# Patient Record
Sex: Female | Born: 1950 | Race: White | Hispanic: No | Marital: Married | Smoking: Never smoker
Health system: Southern US, Community
[De-identification: ages and names within clinical notes are randomized; demographics above are authoritative.]

## PROBLEM LIST (undated history)

## (undated) HISTORY — PX: TUBAL LIGATION: SHX77

---

## 2008-06-10 ENCOUNTER — Ambulatory Visit: Payer: Self-pay | Admitting: Gastroenterology

## 2009-10-08 ENCOUNTER — Ambulatory Visit: Payer: Self-pay | Admitting: Internal Medicine

## 2010-10-19 ENCOUNTER — Ambulatory Visit: Payer: Self-pay | Admitting: Internal Medicine

## 2010-10-21 ENCOUNTER — Ambulatory Visit: Payer: Self-pay | Admitting: Internal Medicine

## 2011-05-18 ENCOUNTER — Ambulatory Visit: Payer: Self-pay | Admitting: Internal Medicine

## 2011-10-31 ENCOUNTER — Ambulatory Visit: Payer: Self-pay | Admitting: Surgery

## 2011-11-15 ENCOUNTER — Ambulatory Visit: Payer: Self-pay | Admitting: Surgery

## 2011-12-29 HISTORY — PX: BREAST BIOPSY: SHX20

## 2012-02-07 ENCOUNTER — Ambulatory Visit: Payer: Self-pay | Admitting: Anesthesiology

## 2012-02-07 DIAGNOSIS — I10 Essential (primary) hypertension: Secondary | ICD-10-CM

## 2012-03-06 ENCOUNTER — Ambulatory Visit: Payer: Self-pay | Admitting: Surgery

## 2012-03-07 LAB — PATHOLOGY REPORT

## 2013-01-24 ENCOUNTER — Ambulatory Visit: Payer: Self-pay | Admitting: Physician Assistant

## 2014-03-14 ENCOUNTER — Ambulatory Visit: Payer: Self-pay | Admitting: Physician Assistant

## 2014-09-16 NOTE — Op Note (Signed)
PATIENT NAME:  Amy BostonBURNETTE, [name] MR#:  [id number] DATE OF BIRTH:  [birth year] ____________________________ J. Renda RollsWilton Dalinda Heidt, MD jws:slb D: 03/06/2012 12:27:16 ET T: 03/06/2012 12:42:23 ET JOB#: [id number]  cc: Adella HareJ. Wilton [name], MD, <Dictator> Adella HareWILTON J Johnmark Geiger MD ELECTRONICALLY SIGNED 03/08/2012 12:38

## 2014-09-16 NOTE — Op Note (Signed)
PATIENT NAME:  Amy BostonBURNETTE, [name] MR#:  [id number] DATE OF BIRTH:  Apr 01, 1951  DATE OF PROCEDURE:  03/06/2012  PREOPERATIVE DIAGNOSIS: Left breast mass.   POSTOPERATIVE DIAGNOSIS: Left breast mass.   PROCEDURE: Excision of left breast mass.   SURGEON: Adella HareJ. Wilton Smith, MD   INDICATIONS: This 64 year old female has history of left breast pain and had mammogram and ultrasound. The ultrasound demonstrated a small mass at the 6 o'clock position in the retroareolar portion of the left breast. She had ultrasound-guided core biopsy which demonstrated sclerosis and hyperplasia and focal area of florid ductal hyperplasia and subsequently excisional biopsy was recommended for further evaluation.   DESCRIPTION OF PROCEDURE: The patient did have preoperative ultrasound-guided insertion of Kopans wire. I reviewed the ultrasound images which demonstrate the location of the small nodule in the retroareolar portion at the 6 o'clock position in the left breast. You could see the location of the hook which had passed just beyond the mass extending from left to right.   The patient was placed on the operating table in the supine position under general anesthesia. The dressing was removed from the left breast exposing the Kopans wire which entered the breast laterally and could palpate and demonstrate the location of the wire at the 6 o'clock position. The wire was cut 3 cm from the skin. The breast was prepared with ChloraPrep and draped in a sterile manner. A curvilinear incision was made directly overlying the wire. The incision was approximately 2 cm in length and removed a narrow ellipse of skin directly overlying the wire. I dissected down around the wire and removed a portion of tissue which was approximately 1.5 x 1 x 1 cm in dimension. There was some firmness within the tissue and was submitted for routine pathology. The wound was inspected. Several small bleeding points were electrocauterized. Hemostasis was  subsequently intact. Tissues were infiltrated with 0.5% Sensorcaine with epinephrine. The wound was closed with a running 5-0 Monocryl subcuticular suture and Dermabond. There was some scant bleeding from the entrance point of the Kopans wire which was dressed with a Band-Aid.  The patient tolerated the procedure satisfactorily and was then prepared for transfer to the recovery room.   ____________________________ Shela CommonsJ. Renda RollsWilton Smith, MD jws:drc D: 03/06/2012 12:31:50 ET T: 03/06/2012 12:53:02 ET JOB#: [id number]  cc: Adella HareJ. Wilton Smith, MD, <Dictator> Adella HareWILTON J SMITH MD ELECTRONICALLY SIGNED 03/08/2012 12:38

## 2015-08-06 ENCOUNTER — Other Ambulatory Visit: Payer: Self-pay | Admitting: Physician Assistant

## 2015-08-06 DIAGNOSIS — Z1231 Encounter for screening mammogram for malignant neoplasm of breast: Secondary | ICD-10-CM

## 2015-08-18 ENCOUNTER — Ambulatory Visit: Payer: Self-pay

## 2015-08-24 ENCOUNTER — Ambulatory Visit
Admission: RE | Admit: 2015-08-24 | Discharge: 2015-08-24 | Disposition: A | Discharge status: Home / Self Care | Payer: BLUE CROSS/BLUE SHIELD | Source: Ambulatory Visit | Attending: Physician Assistant | Admitting: Physician Assistant

## 2015-08-24 DIAGNOSIS — Z1231 Encounter for screening mammogram for malignant neoplasm of breast: Secondary | ICD-10-CM | POA: Diagnosis present

## 2015-12-25 ENCOUNTER — Other Ambulatory Visit: Payer: Self-pay | Admitting: Sports Medicine

## 2015-12-25 ENCOUNTER — Ambulatory Visit
Admission: RE | Admit: 2015-12-25 | Discharge: 2015-12-25 | Disposition: A | Discharge status: Home / Self Care | Payer: BLUE CROSS/BLUE SHIELD | Source: Ambulatory Visit | Attending: Sports Medicine | Admitting: Sports Medicine

## 2015-12-25 DIAGNOSIS — Z01818 Encounter for other preprocedural examination: Secondary | ICD-10-CM | POA: Insufficient documentation

## 2015-12-25 DIAGNOSIS — M795 Residual foreign body in soft tissue: Secondary | ICD-10-CM

## 2016-04-14 ENCOUNTER — Ambulatory Visit (INDEPENDENT_AMBULATORY_CARE_PROVIDER_SITE_OTHER): Payer: BLUE CROSS/BLUE SHIELD | Admitting: Vascular Surgery

## 2016-04-14 ENCOUNTER — Encounter (INDEPENDENT_AMBULATORY_CARE_PROVIDER_SITE_OTHER): Payer: Self-pay | Admitting: Vascular Surgery

## 2016-04-14 DIAGNOSIS — M79605 Pain in left leg: Secondary | ICD-10-CM

## 2016-04-14 DIAGNOSIS — M79609 Pain in unspecified limb: Secondary | ICD-10-CM | POA: Insufficient documentation

## 2016-04-14 DIAGNOSIS — M7989 Other specified soft tissue disorders: Secondary | ICD-10-CM | POA: Diagnosis not present

## 2016-04-14 DIAGNOSIS — I8311 Varicose veins of right lower extremity with inflammation: Secondary | ICD-10-CM

## 2016-04-14 DIAGNOSIS — M79604 Pain in right leg: Secondary | ICD-10-CM

## 2016-04-14 DIAGNOSIS — I8312 Varicose veins of left lower extremity with inflammation: Secondary | ICD-10-CM | POA: Diagnosis not present

## 2016-04-14 DIAGNOSIS — I872 Venous insufficiency (chronic) (peripheral): Secondary | ICD-10-CM | POA: Insufficient documentation

## 2016-04-14 NOTE — Progress Notes (Signed)
MRN : [id number]  Amy Farley is a 65 y.o. (07-05-1950) female who presents with chief complaint of  Chief Complaint  Patient presents with  . New Patient (Initial Visit)  .  History of Present Illness:   The patient is seen for evaluation of symptomatic varicose veins. The patient relates burning and stinging which worsened steadily throughout the course of the day, particularly with standing. The patient also notes an aching and throbbing pain over the varicosities, particularly with prolonged dependent positions. The symptoms are significantly improved with elevation.  The patient also notes that during hot weather the symptoms are greatly intensified. The patient states the pain from the varicose veins interferes with work, daily exercise, shopping and household maintenance. At this point, the symptoms are persistent and severe enough that they're having a negative impact on lifestyle and are interfering with daily activities.  There is no history of DVT, PE or superficial thrombophlebitis. There is no history of ulceration or hemorrhage. The patient denies a significant family history of varicose veins.  The patient has not worn graduated compression in the past. At the present time the patient has not been using over-the-counter analgesics. There is no history of prior surgical intervention or sclerotherapy.  Current Meds  Medication Sig  . cyanocobalamin 1000 MCG tablet Take 1,000 mcg by mouth daily.  . Multiple Vitamin (MULTIVITAMIN) capsule Take 1 capsule by mouth daily.    History reviewed. No pertinent past medical history.  Past Surgical History:  Procedure Laterality Date  . BREAST BIOPSY Left 12/2011   [place] core neg  . TUBAL LIGATION      Social History Social History  Substance Use Topics  . Smoking status: Never Smoker  . Smokeless tobacco: Never Used  . Alcohol use Yes     Comment: occasionally    Family History Family History  Problem Relation Age of  Onset  . Breast cancer Sister 66  . Breast cancer Maternal Grandmother   . Congestive Heart Failure Mother   . Hyperlipidemia Mother   . Hypertension Mother   . Stroke Mother   . Diabetes Father   . Hyperlipidemia Father   . Hypertension Father   No family history of bleeding/clotting disorders, porphyria or autoimmune disease   Allergies  Allergen Reactions  . Sulfa Antibiotics     Other reaction(s): Unknown  . Augmentin [Amoxicillin-Pot Clavulanate] Hives     REVIEW OF SYSTEMS (Negative unless checked)  Constitutional: [] Weight loss  [] Fever  [] Chills Cardiac: [] Chest pain   [] Chest pressure   [] Palpitations   [] Shortness of breath when laying flat   [] Shortness of breath with exertion. Vascular:  [] Pain in legs with walking   [] Pain in legs at rest  [] History of DVT   [] Phlebitis   [] Swelling in legs   [] Varicose veins   [] Non-healing ulcers Pulmonary:   [] Uses home oxygen   [] Productive cough   [] Hemoptysis   [] Wheeze  [] COPD   [] Asthma Neurologic:  [] Dizziness   [] Seizures   [] History of stroke   [] History of TIA  [] Aphasia   [] Vissual changes   [] Weakness or numbness in arm   [] Weakness or numbness in leg Musculoskeletal:   [] Joint swelling   [] Joint pain   [] Low back pain Hematologic:  [] Easy bruising  [] Easy bleeding   [] Hypercoagulable state   [] Anemic Gastrointestinal:  [] Diarrhea   [] Vomiting  [] Gastroesophageal reflux/heartburn   [] Difficulty swallowing. Genitourinary:  [] Chronic kidney disease   [] Difficult urination  [] Frequent urination   [] Blood  in urine Skin:  [] Rashes   [] Ulcers  Psychological:  [] History of anxiety   []  History of major depression.  Physical Examination  Vitals:   04/14/16 1350  BP: 135/77  Pulse: 77  Resp: 16  Weight: 153 lb (69.4 kg)  Height: 5\' 1"  (1.549 m)   Body mass index is 28.91 kg/m. Gen: WD/WN, NAD Head: [place]/AT, No temporalis wasting.  Ear/Nose/Throat: Hearing grossly intact, nares w/o erythema or drainage, poor  dentition Eyes: PER, EOMI, sclera nonicteric.  Neck: Supple, no masses.  No bruit or JVD.  Pulmonary:  Good air movement, clear to auscultation bilaterally, no use of accessory muscles.  Cardiac: RRR, normal S1, S2, no Murmurs. Vascular: varicosities >6 mm bilaterally mild stasis changes 2+ edema Vessel Right Left  Radial Palpable Palpable  Ulnar Palpable Palpable  Brachial Palpable Palpable  Carotid Palpable Palpable  Femoral Palpable Palpable  Popliteal Palpable Palpable  PT Palpable Palpable  DP Palpable Palpable   Gastrointestinal: soft, non-distended. No guarding/no peritoneal signs.  Musculoskeletal: M/S 5/5 throughout.  No deformity or atrophy.  Neurologic: CN 2-12 intact. Pain and light touch intact in extremities.  Symmetrical.  Speech is fluent. Motor exam as listed above. Psychiatric: Judgment intact, Mood & affect appropriate for pt's clinical situation. Dermatologic: No rashes or ulcers noted.  No changes consistent with cellulitis. Lymph : No Cervical lymphadenopathy, no lichenification or skin changes of chronic lymphedema.  CBC No results found for: WBC, HGB, HCT, MCV, PLT  BMET No results found for: NA, K, CL, CO2, GLUCOSE, BUN, CREATININE, CALCIUM, GFRNONAA, GFRAA CrCl cannot be calculated (No order found.).  COAG No results found for: INR, PROTIME  Radiology No results found.  Assessment/Plan 1. Varicose veins of both lower extremities with inflammation Recommend:  The patient has large symptomatic varicose veins that are painful and associated with swelling.  I have had a long discussion with the patient regarding  varicose veins and why they cause symptoms.  Patient will begin wearing graduated compression stockings class 1 on a daily basis, beginning first thing in the morning and removing them in the evening. The patient is instructed specifically not to sleep in the stockings.    The patient  will also begin using over-the-counter analgesics such as  Motrin 600 mg po TID to help control the symptoms.    In addition, behavioral modification including elevation during the day will be initiated.    Pending the results of these changes the  patient will be reevaluated in three months.   An  ultrasound of the venous system will be obtained.   Further plans will be based on the ultrasound results and whether conservative therapies are successful at eliminating the pain and swelling.  2. Venous (peripheral) insufficiency Recommend:  The patient has large symptomatic varicose veins that are painful and associated with swelling.  I have had a long discussion with the patient regarding  varicose veins and why they cause symptoms.  Patient will begin wearing graduated compression stockings class 1 on a daily basis, beginning first thing in the morning and removing them in the evening. The patient is instructed specifically not to sleep in the stockings.    The patient  will also begin using over-the-counter analgesics such as Motrin 600 mg po TID to help control the symptoms.    In addition, behavioral modification including elevation during the day will be initiated.    Pending the results of these changes the  patient will be reevaluated in three months.  An  ultrasound of the venous system will be obtained.   Further plans will be based on the ultrasound results and whether conservative therapies are successful at eliminating the pain and swelling.  3. Pain in both lower extremities See #1  4. Leg swelling See #1    Levora DredgeGregory Clive Parcel, MD  04/14/2016 5:23 PM

## 2016-06-02 ENCOUNTER — Other Ambulatory Visit (INDEPENDENT_AMBULATORY_CARE_PROVIDER_SITE_OTHER): Payer: Self-pay | Admitting: Vascular Surgery

## 2016-06-02 ENCOUNTER — Encounter (INDEPENDENT_AMBULATORY_CARE_PROVIDER_SITE_OTHER): Payer: Self-pay | Admitting: Vascular Surgery

## 2016-06-02 ENCOUNTER — Ambulatory Visit (INDEPENDENT_AMBULATORY_CARE_PROVIDER_SITE_OTHER): Payer: BLUE CROSS/BLUE SHIELD | Admitting: Vascular Surgery

## 2016-06-02 ENCOUNTER — Ambulatory Visit (INDEPENDENT_AMBULATORY_CARE_PROVIDER_SITE_OTHER): Payer: BLUE CROSS/BLUE SHIELD

## 2016-06-02 VITALS — BP 141/74 | HR 70 | Resp 17 | Wt 152.4 lb

## 2016-06-02 DIAGNOSIS — M79605 Pain in left leg: Secondary | ICD-10-CM

## 2016-06-02 DIAGNOSIS — M7989 Other specified soft tissue disorders: Secondary | ICD-10-CM

## 2016-06-02 DIAGNOSIS — I8312 Varicose veins of left lower extremity with inflammation: Secondary | ICD-10-CM

## 2016-06-02 DIAGNOSIS — M79604 Pain in right leg: Secondary | ICD-10-CM | POA: Diagnosis not present

## 2016-06-02 DIAGNOSIS — I872 Venous insufficiency (chronic) (peripheral): Secondary | ICD-10-CM

## 2016-06-02 DIAGNOSIS — I8311 Varicose veins of right lower extremity with inflammation: Secondary | ICD-10-CM | POA: Diagnosis not present

## 2016-06-02 DIAGNOSIS — I8390 Asymptomatic varicose veins of unspecified lower extremity: Secondary | ICD-10-CM

## 2016-06-02 NOTE — Progress Notes (Signed)
MRN : [id number]  Amy Farley is a 66 y.o. (04/05/1951) female who presents with chief complaint of  Chief Complaint  Patient presents with  . Follow-up  .  History of Present Illness: The patient returns for followup after the initial visit. The patient continues to have pain in the lower extremities with dependency. The pain is lessened with elevation. Graduated compression stockings, Class I (20-30 mmHg), have been worn but the stockings do not eliminate the leg pain. Over-the-counter analgesics do not improve the symptoms. The degree of discomfort continues to interfere with daily activities. The patient notes the pain in the legs is causing problems with daily exercise, at the workplace and even with household activities and maintenance such as standing in the kitchen preparing meals and doing dishes.   Venous ultrasound shows normal deep venous system, no evidence of acute or chronic DVT.  Superficial reflux is not present.  Current Meds  Medication Sig  . cyanocobalamin 1000 MCG tablet Take 1,000 mcg by mouth daily.  . Multiple Vitamin (MULTIVITAMIN) capsule Take 1 capsule by mouth daily.    No past medical history on file.  Past Surgical History:  Procedure Laterality Date  . BREAST BIOPSY Left 12/2011   [place] core neg  . TUBAL LIGATION      Social History Social History  Substance Use Topics  . Smoking status: Never Smoker  . Smokeless tobacco: Never Used  . Alcohol use Yes     Comment: occasionally    Family History Family History  Problem Relation Age of Onset  . Breast cancer Sister 39  . Breast cancer Maternal Grandmother   . Congestive Heart Failure Mother   . Hyperlipidemia Mother   . Hypertension Mother   . Stroke Mother   . Diabetes Father   . Hyperlipidemia Father   . Hypertension Father   No family history of bleeding/clotting disorders, porphyria or autoimmune disease   Allergies  Allergen Reactions  . Sulfa Antibiotics     Other  reaction(s): Unknown  . Augmentin [Amoxicillin-Pot Clavulanate] Hives     REVIEW OF SYSTEMS (Negative unless checked)  Constitutional: [] Weight loss  [] Fever  [] Chills Cardiac: [] Chest pain   [] Chest pressure   [] Palpitations   [] Shortness of breath when laying flat   [] Shortness of breath with exertion. Vascular:  [] Pain in legs with walking   [x] Pain in legs with standing  [] History of DVT   [] Phlebitis   [x] Swelling in legs   [] Varicose veins   [] Non-healing ulcers Pulmonary:   [] Uses home oxygen   [] Productive cough   [] Hemoptysis   [] Wheeze  [] COPD   [] Asthma Neurologic:  [] Dizziness   [] Seizures   [] History of stroke   [] History of TIA  [] Aphasia   [] Vissual changes   [] Weakness or numbness in arm   [] Weakness or numbness in leg Musculoskeletal:   [] Joint swelling   [] Joint pain   [] Low back pain Hematologic:  [] Easy bruising  [] Easy bleeding   [] Hypercoagulable state   [] Anemic Gastrointestinal:  [] Diarrhea   [] Vomiting  [] Gastroesophageal reflux/heartburn   [] Difficulty swallowing. Genitourinary:  [] Chronic kidney disease   [] Difficult urination  [] Frequent urination   [] Blood in urine Skin:  [] Rashes   [] Ulcers  Psychological:  [] History of anxiety   []  History of major depression.  Physical Examination  Vitals:   06/02/16 1539  BP: (!) 141/74  Pulse: 70  Resp: 17  Weight: 152 lb 6.4 oz (69.1 kg)   Body mass index is 28.8 kg/m.  Gen: WD/WN, NAD Head: [place]/AT, No temporalis wasting.  Ear/Nose/Throat: Hearing grossly intact, nares w/o erythema or drainage, poor dentition Eyes: PER, EOMI, sclera nonicteric.  Neck: Supple, no masses.  No bruit or JVD.  Pulmonary:  Good air movement, clear to auscultation bilaterally, no use of accessory muscles.  Cardiac: RRR, normal S1, S2, no Murmurs. Vascular:  Multiple small varicosities and spider veins, 1-2+ edema bilaterally Vessel Right Left  Radial Palpable Palpable  Ulnar Palpable Palpable  Brachial Palpable Palpable  Carotid  Palpable Palpable  Femoral Palpable Palpable  Popliteal Palpable Palpable  PT Palpable Palpable  DP Palpable Palpable   Gastrointestinal: soft, non-distended. No guarding/no peritoneal signs.  Musculoskeletal: M/S 5/5 throughout.  No deformity or atrophy.  Neurologic: CN 2-12 intact. Pain and light touch intact in extremities.  Symmetrical.  Speech is fluent. Motor exam as listed above. Psychiatric: Judgment intact, Mood & affect appropriate for pt's clinical situation. Dermatologic: No rashes or ulcers noted.  No changes consistent with cellulitis. Lymph : No Cervical lymphadenopathy, no lichenification or skin changes of chronic lymphedema.  CBC No results found for: WBC, HGB, HCT, MCV, PLT  BMET No results found for: NA, K, CL, CO2, GLUCOSE, BUN, CREATININE, CALCIUM, GFRNONAA, GFRAA CrCl cannot be calculated (No order found.).  COAG No results found for: INR, PROTIME  Radiology No results found.   Assessment/Plan 1. Varicose veins of both lower extremities with inflammation Recommend:  The patient has had successful ablation of the previously incompetent saphenous venous system but still has persistent symptoms of pain and swelling that are having a negative impact on daily life and daily activities.  Patient should undergo injection sclerotherapy to treat the residual varicosities.  The risks, benefits and alternative therapies were reviewed in detail with the patient.  All questions were answered.  The patient agrees to proceed with sclerotherapy at their convenience.  The patient will continue wearing the graduated compression stockings and using the over-the-counter pain medications to treat her symptoms.   - Sclerotherapy; Future  2. Venous (peripheral) insufficiency No surgery or intervention at this point in time.    I have had a long discussion with the patient regarding venous insufficiency and why it  causes symptoms. I have discussed with the patient the  chronic skin changes that accompany venous insufficiency and the long term sequela such as infection and ulceration.  Patient will begin wearing graduated compression stockings class 1 (20-30 mmHg) or compression wraps on a daily basis a prescription was given. The patient will put the stockings on first thing in the morning and removing them in the evening. The patient is instructed specifically not to sleep in the stockings.    In addition, behavioral modification including several periods of elevation of the lower extremities during the day will be continued. I have demonstrated that proper elevation is a position with the ankles at heart level.  The patient is instructed to begin routine exercise, especially walking on a daily basis  3. Pain in both lower extremities See Plan #1  4. Leg swelling See Plan #2    Levora DredgeGregory Lorely Bubb, MD  06/02/2016 5:21 PM

## 2016-06-27 ENCOUNTER — Ambulatory Visit (INDEPENDENT_AMBULATORY_CARE_PROVIDER_SITE_OTHER): Payer: BLUE CROSS/BLUE SHIELD | Admitting: Vascular Surgery

## 2016-07-11 ENCOUNTER — Other Ambulatory Visit: Payer: Self-pay | Admitting: Physician Assistant

## 2016-07-11 DIAGNOSIS — Z1231 Encounter for screening mammogram for malignant neoplasm of breast: Secondary | ICD-10-CM

## 2016-08-09 ENCOUNTER — Ambulatory Visit
Admission: RE | Admit: 2016-08-09 | Discharge: 2016-08-09 | Disposition: A | Discharge status: Home / Self Care | Payer: BLUE CROSS/BLUE SHIELD | Source: Ambulatory Visit | Attending: Physician Assistant | Admitting: Physician Assistant

## 2016-08-09 DIAGNOSIS — Z1231 Encounter for screening mammogram for malignant neoplasm of breast: Secondary | ICD-10-CM | POA: Insufficient documentation

## 2017-07-31 ENCOUNTER — Other Ambulatory Visit: Payer: Self-pay | Admitting: Physician Assistant

## 2017-07-31 DIAGNOSIS — Z1231 Encounter for screening mammogram for malignant neoplasm of breast: Secondary | ICD-10-CM

## 2017-08-10 ENCOUNTER — Ambulatory Visit
Admission: RE | Admit: 2017-08-10 | Discharge: 2017-08-10 | Disposition: A | Discharge status: Home / Self Care | Payer: Self-pay | Source: Ambulatory Visit | Attending: Physician Assistant | Admitting: Physician Assistant

## 2017-08-10 DIAGNOSIS — Z1231 Encounter for screening mammogram for malignant neoplasm of breast: Secondary | ICD-10-CM | POA: Insufficient documentation

## 2018-02-16 ENCOUNTER — Other Ambulatory Visit: Payer: Self-pay | Admitting: Physician Assistant

## 2018-02-16 DIAGNOSIS — R1032 Left lower quadrant pain: Secondary | ICD-10-CM

## 2018-02-16 DIAGNOSIS — R1013 Epigastric pain: Secondary | ICD-10-CM

## 2018-02-23 ENCOUNTER — Other Ambulatory Visit: Payer: Self-pay

## 2018-03-06 ENCOUNTER — Ambulatory Visit
Admission: RE | Admit: 2018-03-06 | Discharge: 2018-03-06 | Disposition: A | Discharge status: Home / Self Care | Payer: BLUE CROSS/BLUE SHIELD | Source: Ambulatory Visit | Attending: Physician Assistant | Admitting: Physician Assistant

## 2018-03-06 DIAGNOSIS — R1013 Epigastric pain: Secondary | ICD-10-CM

## 2018-03-06 DIAGNOSIS — R1032 Left lower quadrant pain: Secondary | ICD-10-CM

## 2018-08-07 ENCOUNTER — Other Ambulatory Visit: Payer: Self-pay | Admitting: Physician Assistant

## 2018-08-07 DIAGNOSIS — Z1231 Encounter for screening mammogram for malignant neoplasm of breast: Secondary | ICD-10-CM

## 2018-08-14 ENCOUNTER — Other Ambulatory Visit: Payer: Self-pay | Admitting: Physician Assistant

## 2018-08-14 DIAGNOSIS — N644 Mastodynia: Secondary | ICD-10-CM

## 2018-08-28 ENCOUNTER — Ambulatory Visit
Admission: RE | Admit: 2018-08-28 | Discharge: 2018-08-28 | Disposition: A | Discharge status: Home / Self Care | Payer: Medicare Other | Source: Ambulatory Visit | Attending: Physician Assistant | Admitting: Physician Assistant

## 2018-08-28 ENCOUNTER — Other Ambulatory Visit: Payer: Self-pay | Admitting: Physician Assistant

## 2018-08-28 ENCOUNTER — Other Ambulatory Visit: Payer: Self-pay

## 2018-08-28 DIAGNOSIS — N644 Mastodynia: Secondary | ICD-10-CM

## 2018-08-28 DIAGNOSIS — N6320 Unspecified lump in the left breast, unspecified quadrant: Secondary | ICD-10-CM

## 2019-08-02 IMAGING — MG DIGITAL DIAGNOSTIC BILATERAL MAMMOGRAM WITH TOMO AND CAD
8 series · 8 of 24 positions shown · non-contrast
Comparison: Previous exam(s).

CLINICAL DATA: 67-year-old female presenting for evaluation of left
breast pain, which begins around the nipple and radiates to the
superior upper-outer left breast.

EXAM:
DIGITAL DIAGNOSTIC BILATERAL MAMMOGRAM WITH CAD AND TOMO
LEFT BREAST ULTRASOUND

[L MLO synth-2D]
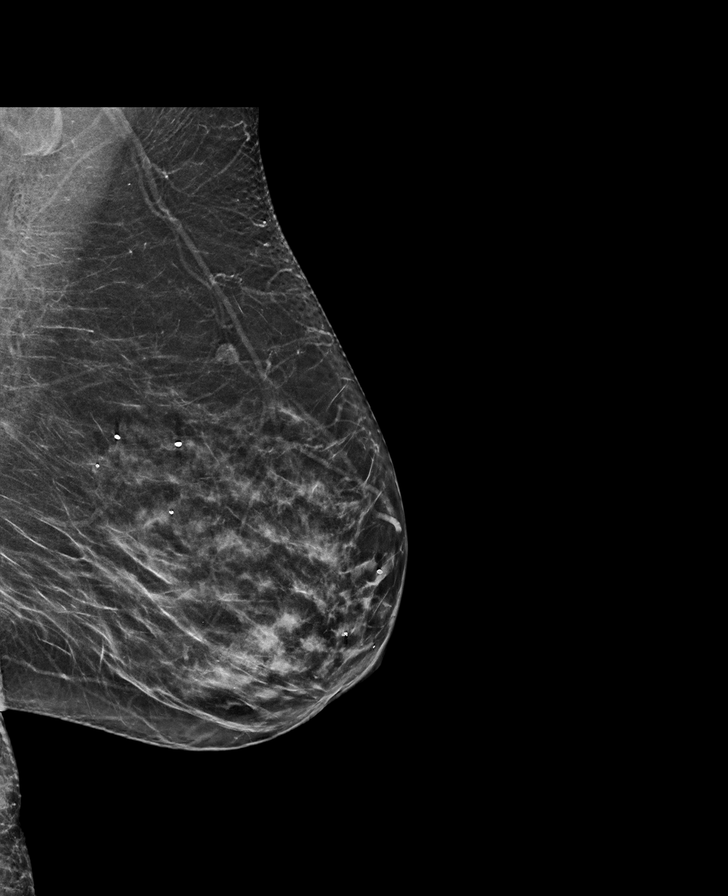

[R CC synth-2D]
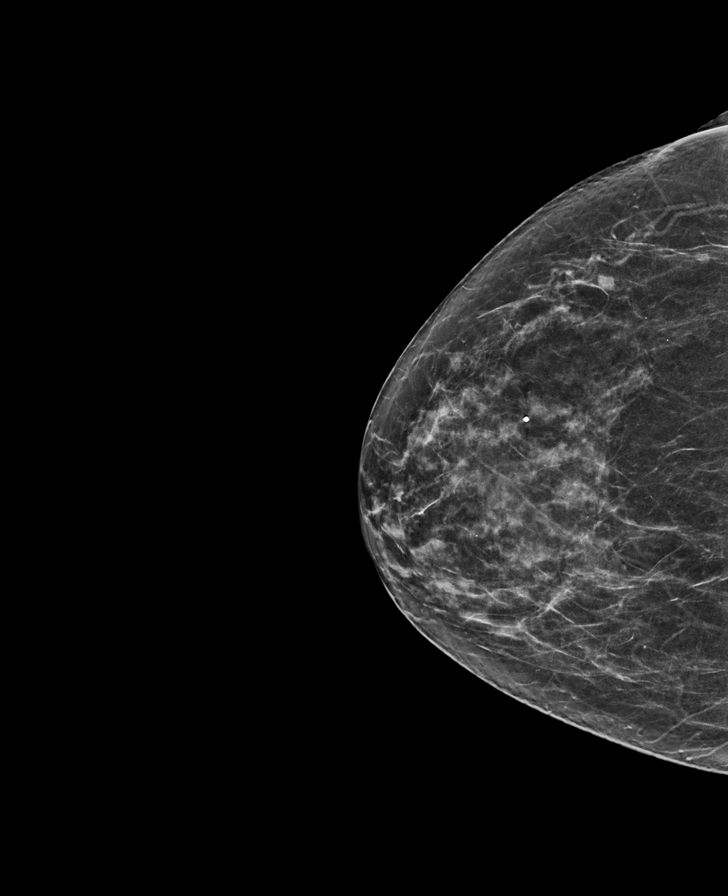

[L CC synth-2D]
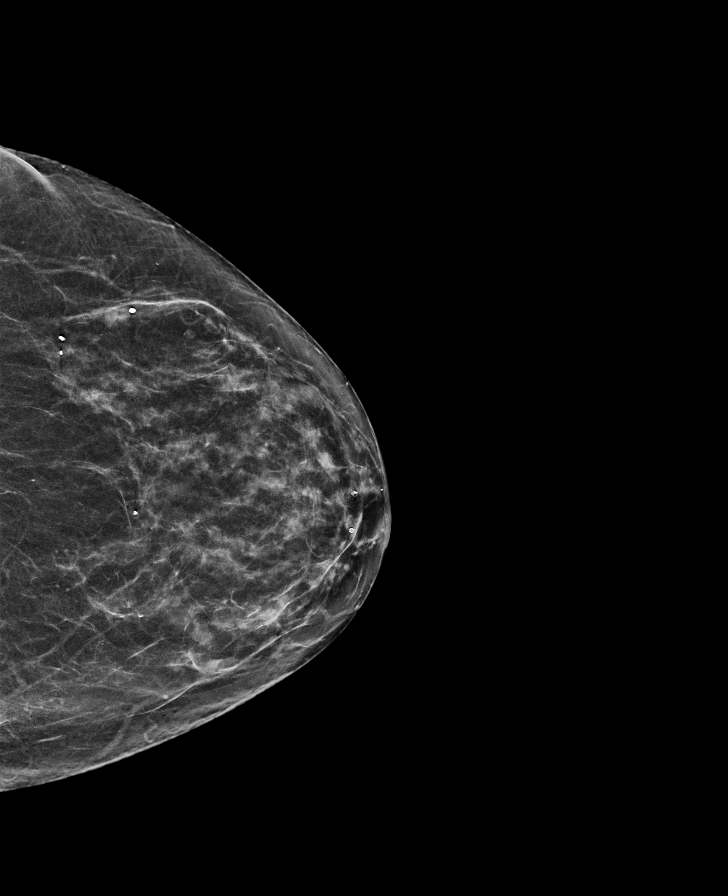

[R MLO synth-2D]
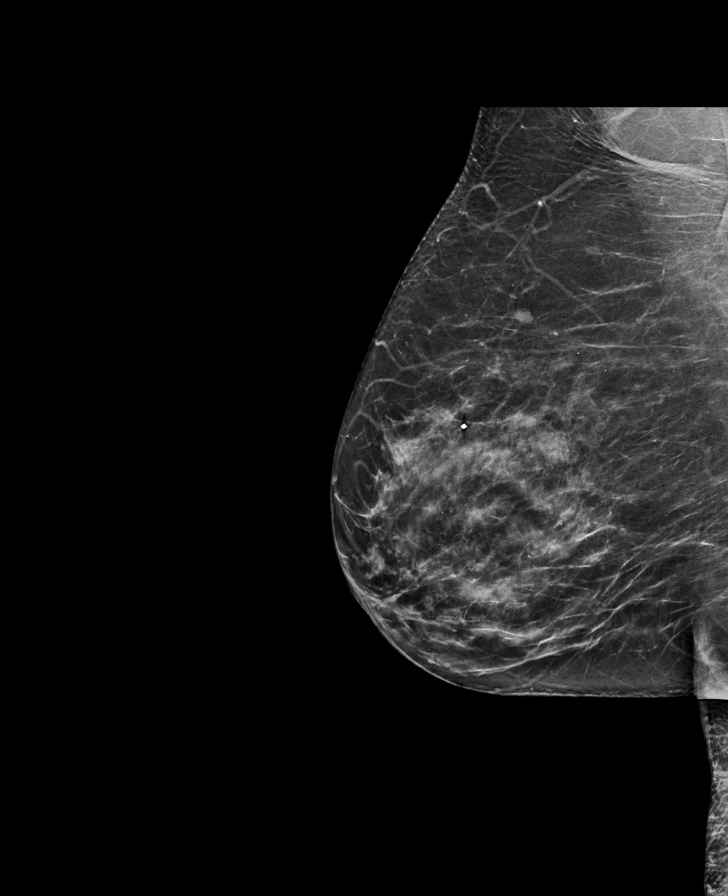

[R CC tomo · tomo slice 33/64.0]
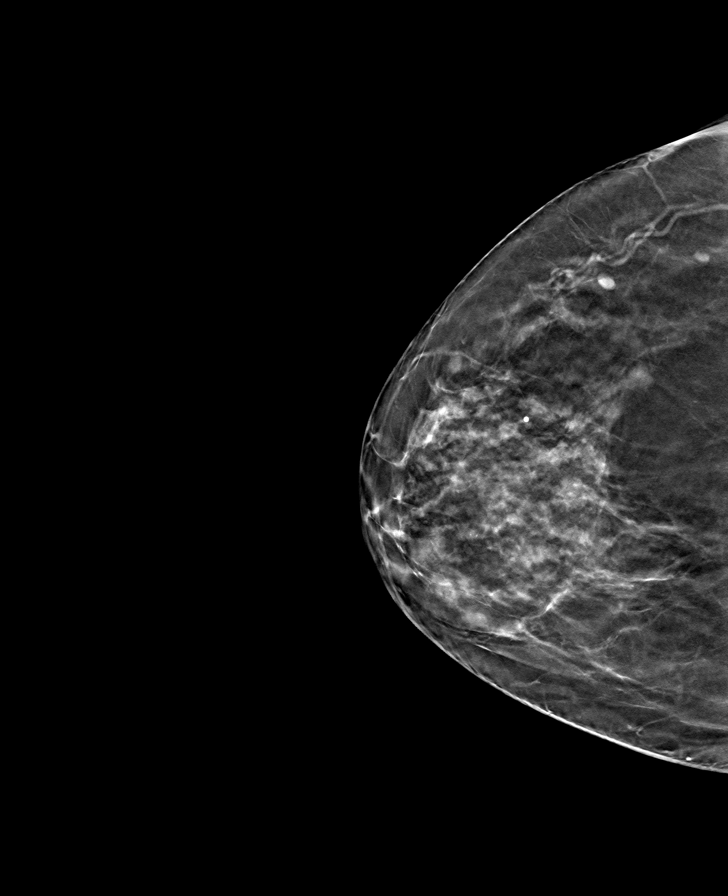

[L MLO tomo · tomo slice 35/68.0]
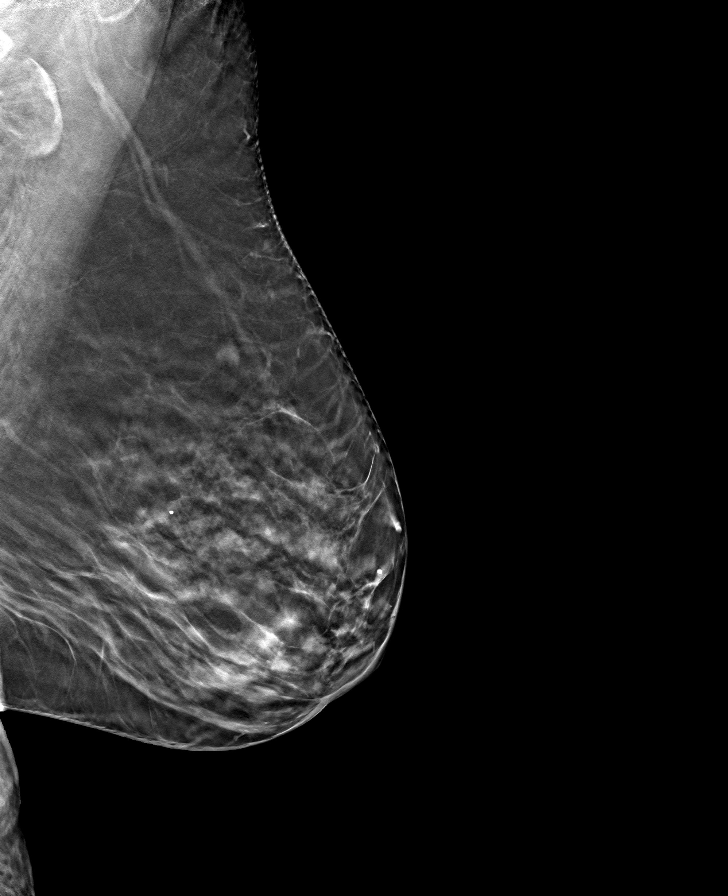

[R MLO tomo · tomo slice 35/70.0]
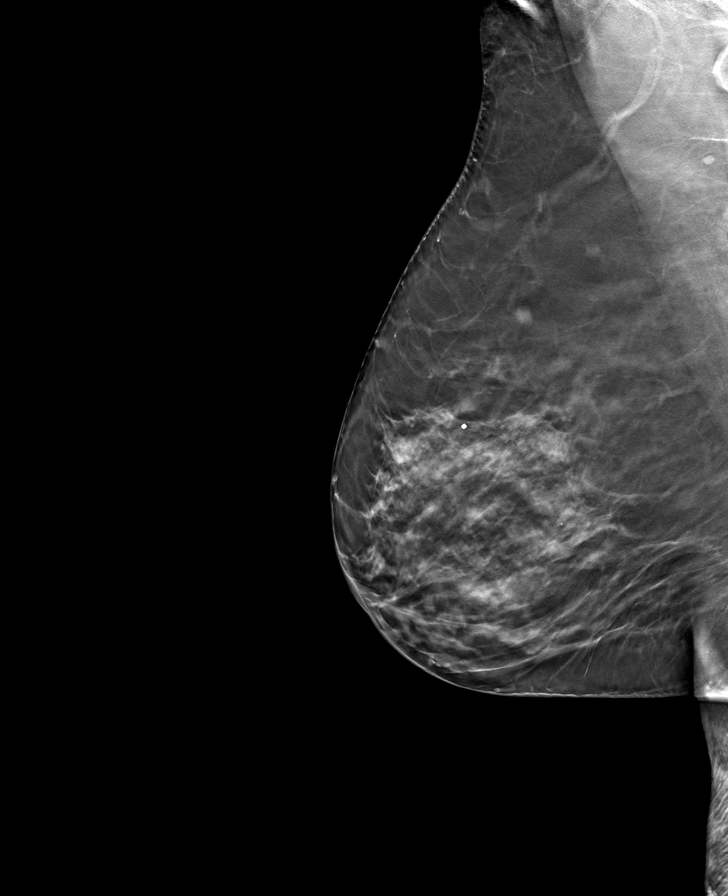

[L CC tomo · tomo slice 33/64.0]
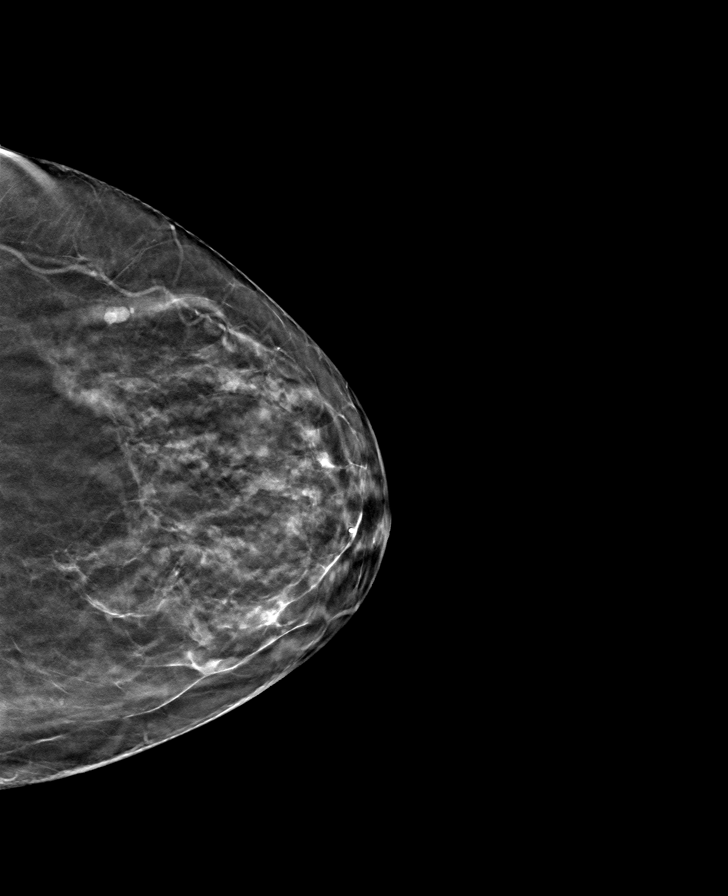

[8 of 24 positions shown; findings below may reference images not displayed]

ACR Breast Density Category c: The breast tissue is heterogeneously
dense, which may obscure small masses.
FINDINGS: No suspicious calcifications, masses or areas of distortion are seen
in the bilateral breasts.

Mammographic images were processed with CAD.

Ultrasound of the retroareolar left breast demonstrates an irregular
hypoechoic mass at 2 o'clock, 1 cm from the nipple measuring 1.1 x
0.4 x 0.8 cm. No blood flow seen within the mass on color Doppler
imaging. There does appear to be a tail extending from the mass
which may represent a duct. Ultrasound of the left axilla
demonstrates multiple normal-appearing lymph nodes.
IMPRESSION: 1. There is an indeterminate possible intraductal left breast mass
at 2 o'clock.

2.  No evidence of left axillary lymphadenopathy.

3.  No evidence of malignancy in the right breast.

RECOMMENDATION:
Ultrasound guided biopsy is recommended for the left breast mass.
This has been scheduled for 08/28/2018 at [DATE] p.m.

I have discussed the findings and recommendations with the patient.
Results were also provided in writing at the conclusion of the
visit. If applicable, a reminder letter will be sent to the patient
regarding the next appointment.

BI-RADS CATEGORY  4: Suspicious.

## 2019-08-02 IMAGING — US US BREAST BX W LOC DEV 1ST LESION IMG BX SPEC US GUIDE*L*
1 series · 9 of 9 positions shown · non-contrast
Comparison: Previous exam(s).
COMPARISON: Previous exam(s).

Addendum:
CLINICAL DATA: 67-year-old female presenting for ultrasound-guided
biopsy of a left breast mass.

EXAM:
ULTRASOUND GUIDED LEFT BREAST CORE NEEDLE BIOPSY

[Series 1: us breast bx w loc dev 1st lesion img bx spec us g · 0.06mm/px · 9 of 9 slices shown]
[im 1/9]
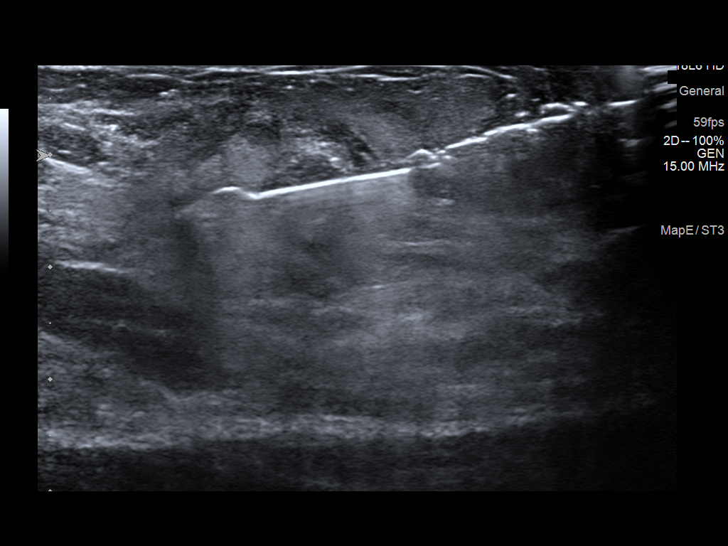
[im 2/9]
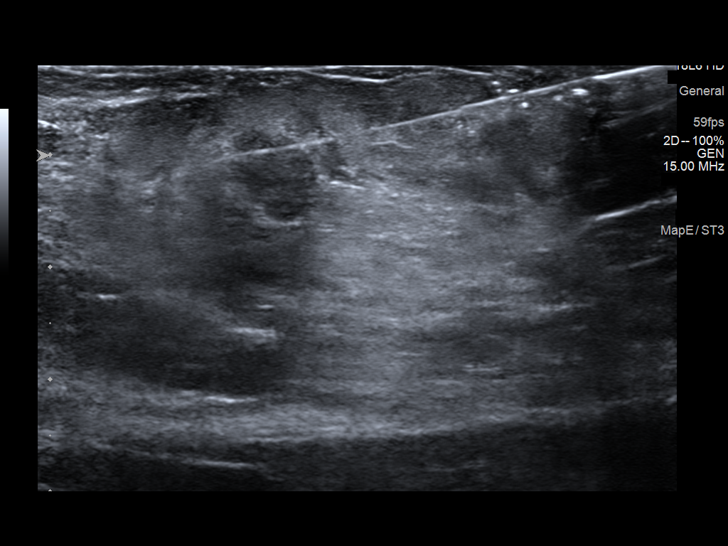
[im 3/9]
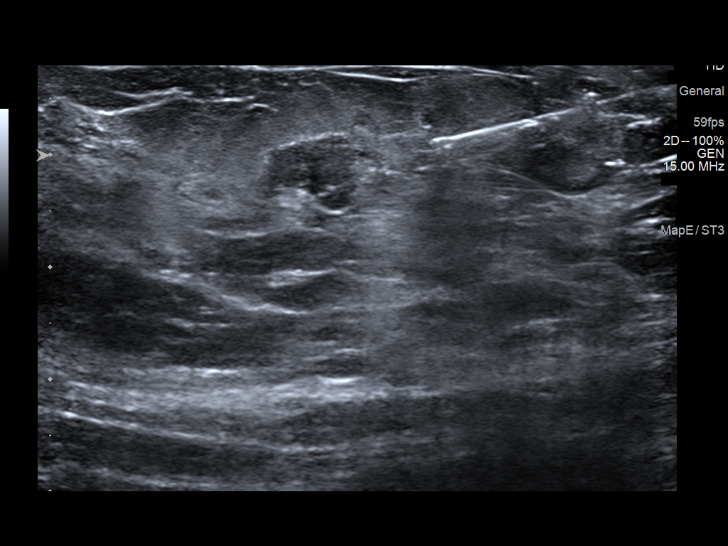
[im 4/9]
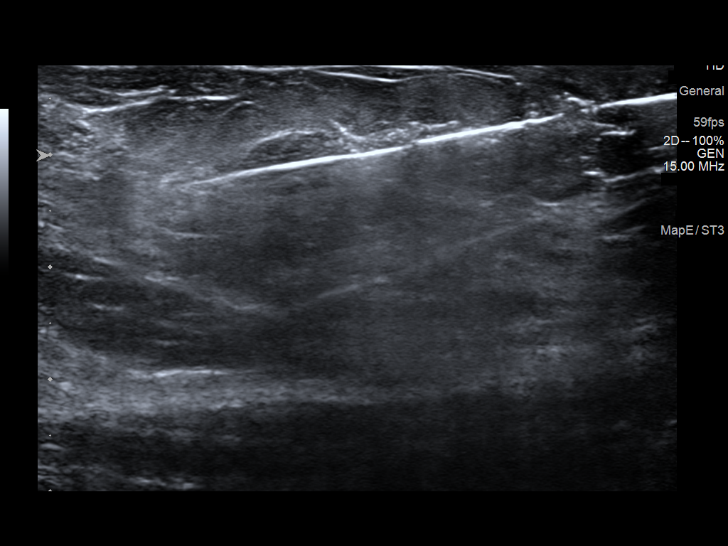
[im 5/9]
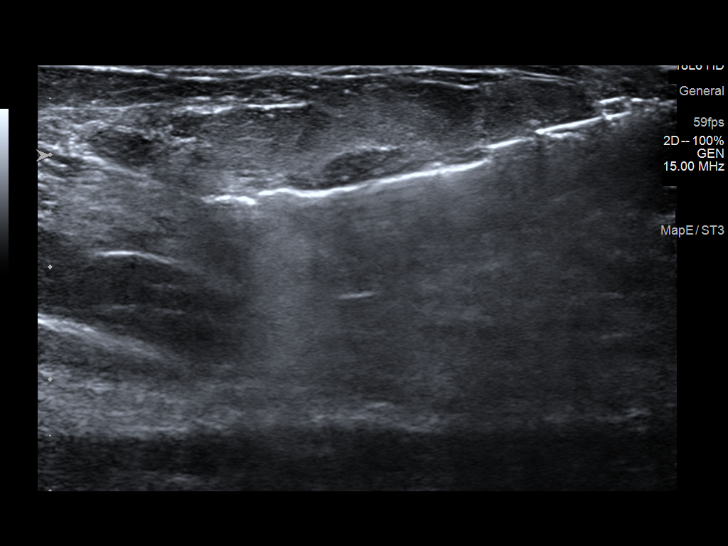
[im 6/9]
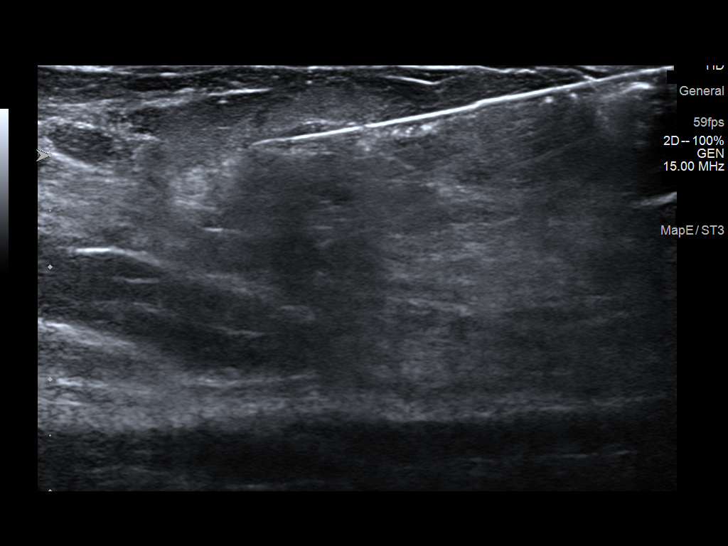
[im 7/9]
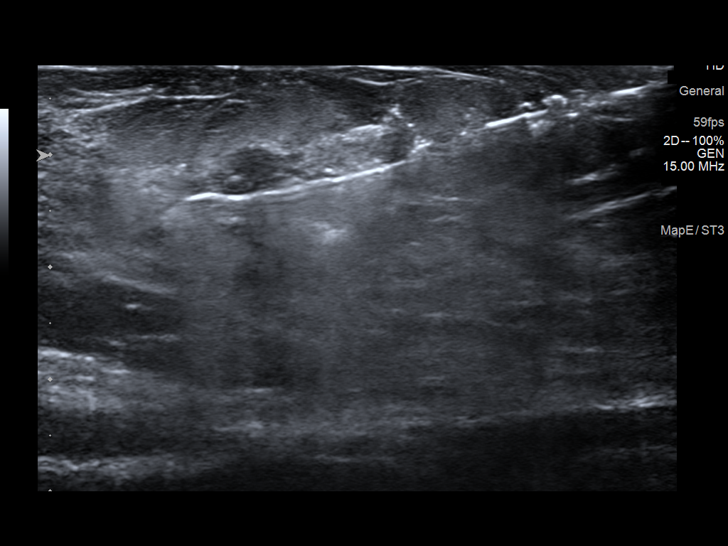
[im 8/9]
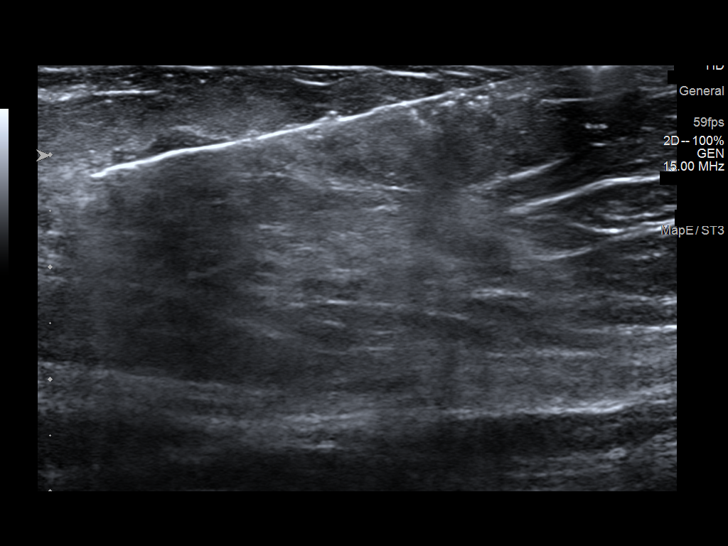
[im 9/9]
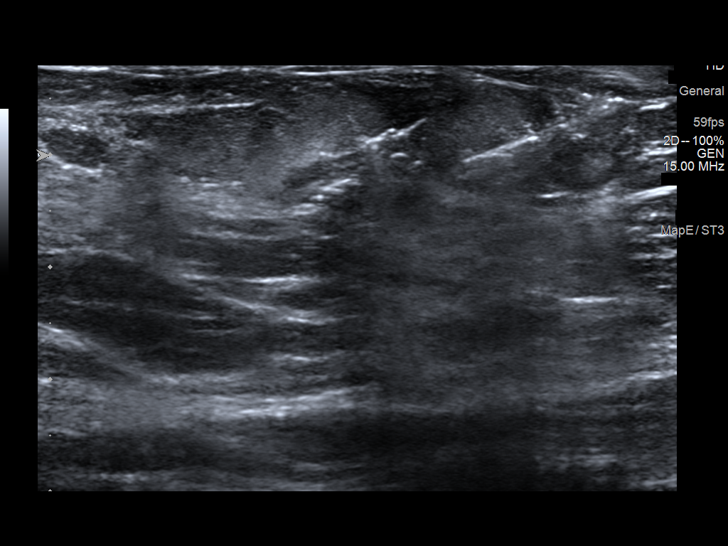

[9 of 9 positions shown; findings below may reference images not displayed]



Lesion quadrant: Upper-outer quadrant

Using sterile technique and 1% Lidocaine as local anesthetic, under
direct ultrasound visualization, a 14 gauge Mayr device was
used to perform biopsy of a mass in the left breast at 2 o'clock
using a lateral approach. At the conclusion of the procedure a
ribbon shaped tissue marker clip was deployed into the biopsy
cavity. Follow up 2 view mammogram was performed and dictated
separately.
IMPRESSION: Ultrasound guided biopsy of a left breast mass at 2 o'clock. No
apparent complications.

ADDENDUM:
Pathology revealed BENIGN BREAST PARENCHYMA WITH FIBROCYSTIC CHANGE
of the Left breast, 2 o'clock. This was found to be concordant by
Dr. Kamesha Calder.

Pathology results were discussed with the patient by telephone. The
patient reported doing well after the biopsy with tenderness at the
site. Post biopsy instructions and care were reviewed and questions
were answered. The patient was encouraged to call The [REDACTED]

The patient was instructed to return for annual screening
mammography and informed at [HOSPITAL] [REDACTED] in
[HOSPITAL][HOSPITAL].

Pathology results reported by Bino Tiger, RN on 08/29/2018.



Lesion quadrant: Upper-outer quadrant

Using sterile technique and 1% Lidocaine as local anesthetic, under
direct ultrasound visualization, a 14 gauge Mayr device was
used to perform biopsy of a mass in the left breast at 2 o'clock
using a lateral approach. At the conclusion of the procedure a
ribbon shaped tissue marker clip was deployed into the biopsy
cavity. Follow up 2 view mammogram was performed and dictated
separately.
IMPRESSION: Ultrasound guided biopsy of a left breast mass at 2 o'clock. No
apparent complications.
